# Patient Record
Sex: Male | Born: 1975 | Hispanic: Yes | Marital: Single | Smoking: Former smoker
Health system: Southern US, Community
[De-identification: ages and names within clinical notes are randomized; demographics above are authoritative.]

## PROBLEM LIST (undated history)

## (undated) DIAGNOSIS — E063 Autoimmune thyroiditis: Secondary | ICD-10-CM

---

## 2011-10-16 ENCOUNTER — Other Ambulatory Visit: Payer: Self-pay | Admitting: Family Medicine

## 2011-10-16 DIAGNOSIS — R748 Abnormal levels of other serum enzymes: Secondary | ICD-10-CM

## 2011-10-18 ENCOUNTER — Ambulatory Visit
Admission: RE | Admit: 2011-10-18 | Discharge: 2011-10-18 | Disposition: A | Discharge status: Home / Self Care | Payer: BC Managed Care – PPO | Source: Ambulatory Visit | Attending: Family Medicine | Admitting: Family Medicine

## 2011-10-18 ENCOUNTER — Other Ambulatory Visit: Payer: Self-pay | Admitting: Family Medicine

## 2011-10-18 DIAGNOSIS — R748 Abnormal levels of other serum enzymes: Secondary | ICD-10-CM

## 2011-11-16 ENCOUNTER — Ambulatory Visit (INDEPENDENT_AMBULATORY_CARE_PROVIDER_SITE_OTHER): Payer: BC Managed Care – PPO | Admitting: General Surgery

## 2011-11-16 ENCOUNTER — Encounter (INDEPENDENT_AMBULATORY_CARE_PROVIDER_SITE_OTHER): Payer: Self-pay | Admitting: General Surgery

## 2011-11-16 VITALS — BP 124/76 | HR 72 | Temp 97.8°F | Resp 16 | Ht 68.0 in | Wt 201.6 lb

## 2011-11-16 DIAGNOSIS — K828 Other specified diseases of gallbladder: Secondary | ICD-10-CM

## 2011-11-16 NOTE — Progress Notes (Signed)
Patient ID: Clifford Rosario, male   DOB: 18-Jan-1976, 36 y.o.   MRN: [id number]  Chief Complaint  Patient presents with  . Pre-op Exam    eval GB w/ polyps    HPI Clifford Rosario is a 36 y.o. male.  This patient is referred by Dr. Paulino Rily for evaluation of elevated LFTs and gallbladder polyps. He saw his primary physician for evaluation of some tightness in his right abdomen but this has since resolved. She ordered some LFTs and hepatitis panel as well as a right upper quadrant ultrasound and the ultrasound was concerning for a 7 mm gallbladder polyp as well as some abnormality with liver appeared.  He says that he now has this tightness in his left lower quadrant and denies any food association with his abdominal discomfort. He says that his bowels are normal without any blood in the stools or melena he does have well controlled acid reflux. He denies any fevers or chills or other associated symptoms.  No weight loss. HPI  History reviewed. No pertinent past medical history. GERD History reviewed. No pertinent past surgical history.  History reviewed. No pertinent family history.  Social History History  Substance Use Topics  . Smoking status: Former Smoker    Quit date: 11/16/2011  . Smokeless tobacco: Never Used  . Alcohol Use: No    No Known Allergies  Current Outpatient Prescriptions  Medication Sig Dispense Refill  . omeprazole (PRILOSEC) 10 MG capsule Take 10 mg by mouth daily.        Review of Systems Review of Systems All other review of systems negative or noncontributory except as stated in the HPI  Blood pressure 124/76, pulse 72, temperature 97.8 F (36.6 C), temperature source Temporal, resp. rate 16, height 5\' 8"  (1.727 m), weight 201 lb 9.6 oz (91.445 kg).  Physical Exam Physical Exam Physical Exam  Vitals reviewed. Constitutional: He is oriented to person, place, and time. He appears well-developed and well-nourished. No distress.  HENT:  Head: Normocephalic and  atraumatic.  Mouth/Throat: No oropharyngeal exudate.  Eyes: Conjunctivae and EOM are normal. Pupils are equal, round, and reactive to light. Right eye exhibits no discharge. Left eye exhibits no discharge. No scleral icterus.  Neck: Normal range of motion. No tracheal deviation present.  Cardiovascular: Normal rate, regular rhythm and normal heart sounds.   Pulmonary/Chest: Effort normal and breath sounds normal. No stridor. No respiratory distress. He has no wheezes. He has no rales. He exhibits no tenderness.  Abdominal: Soft. Bowel sounds are normal. He exhibits no distension and no mass. There is no tenderness. There is no rebound and no guarding.  Musculoskeletal: Normal range of motion. He exhibits no edema and no tenderness.  Neurological: He is alert and oriented to person, place, and time.  Skin: Skin is warm and dry. No rash noted. He is not diaphoretic. No erythema. No pallor.  Psychiatric: He has a normal mood and affect. His behavior is normal. Judgment and thought content normal.    Data Reviewed   Assessment    Abnormal liver per ultrasound We will check an MRI of the abdomen as recommended by radiology to  Evaluate his liver abnormality. This may also clarify the issue of his gallbladder polyps. Abnormal LFTs and gallbladder polyps These are most likely nonmobile gallstones but could also present gallbladder polyps as well. From a size criteria, there are not that concerning. I offered him surgery now to include endoscopic cholecystectomy versus six-month interval followup ultrasound to evaluate any growth of  these polyps. He is interested in six-month followup ultrasound since he is currently asymptomatic. We will put him in for repeat ultrasound in 6 months and if he has any interval increase in the size of his polyps or develops any abdominal discomfort which could be related to gallstones, then we will see him back sooner for possible cholecystectomy. I did explain that these  could represent malignancy but generally at this size, they are not concerning.    Plan    MRI of the abdomen and six-month followup ultrasound       Kimbely Whiteaker DAVID 11/16/2011, 9:00 AM

## 2011-11-21 ENCOUNTER — Telehealth (INDEPENDENT_AMBULATORY_CARE_PROVIDER_SITE_OTHER): Payer: Self-pay

## 2011-11-21 ENCOUNTER — Other Ambulatory Visit (INDEPENDENT_AMBULATORY_CARE_PROVIDER_SITE_OTHER): Payer: Self-pay | Admitting: General Surgery

## 2011-11-21 DIAGNOSIS — R160 Hepatomegaly, not elsewhere classified: Secondary | ICD-10-CM

## 2011-11-21 NOTE — Telephone Encounter (Signed)
Patient was LM to call re: scheduling MRI.

## 2011-11-21 NOTE — Telephone Encounter (Signed)
LMOV pt has MRI and [place] scheduled at [facility] ([address]) on 11/28/11 with a 7:45am arrival.  Pt may call for detailed instructions.  NPO after midnight.

## 2011-11-28 ENCOUNTER — Ambulatory Visit
Admission: RE | Admit: 2011-11-28 | Discharge: 2011-11-28 | Disposition: A | Discharge status: Home / Self Care | Payer: BC Managed Care – PPO | Source: Ambulatory Visit | Attending: General Surgery | Admitting: General Surgery

## 2011-11-28 DIAGNOSIS — R160 Hepatomegaly, not elsewhere classified: Secondary | ICD-10-CM

## 2011-11-28 DIAGNOSIS — K828 Other specified diseases of gallbladder: Secondary | ICD-10-CM

## 2011-11-28 MED ORDER — GADOBENATE DIMEGLUMINE 529 MG/ML IV SOLN
18.0000 mL | Freq: Once | INTRAVENOUS | Status: AC | PRN
  Administered 2011-11-28: 18 mL via INTRAVENOUS

## 2011-12-06 ENCOUNTER — Telehealth (INDEPENDENT_AMBULATORY_CARE_PROVIDER_SITE_OTHER): Payer: Self-pay

## 2011-12-06 ENCOUNTER — Other Ambulatory Visit (INDEPENDENT_AMBULATORY_CARE_PROVIDER_SITE_OTHER): Payer: Self-pay

## 2011-12-06 DIAGNOSIS — K824 Cholesterolosis of gallbladder: Secondary | ICD-10-CM

## 2011-12-06 NOTE — Telephone Encounter (Signed)
Left message for patient to call our office re: MRI results- okay, need's f/u US and office visit in 6 months.

## 2011-12-06 NOTE — Telephone Encounter (Signed)
Patient notified of MRI results, 6 month follow up abd U/S and office visit recommended per Dr. Biagio Quint.

## 2012-03-26 ENCOUNTER — Other Ambulatory Visit: Payer: Self-pay | Admitting: Family Medicine

## 2012-03-26 DIAGNOSIS — K824 Cholesterolosis of gallbladder: Secondary | ICD-10-CM

## 2012-03-27 ENCOUNTER — Encounter (INDEPENDENT_AMBULATORY_CARE_PROVIDER_SITE_OTHER): Payer: Self-pay | Admitting: General Surgery

## 2012-04-02 ENCOUNTER — Ambulatory Visit
Admission: RE | Admit: 2012-04-02 | Discharge: 2012-04-02 | Disposition: A | Discharge status: Home / Self Care | Payer: BC Managed Care – PPO | Source: Ambulatory Visit | Attending: Family Medicine | Admitting: Family Medicine

## 2012-04-02 DIAGNOSIS — K824 Cholesterolosis of gallbladder: Secondary | ICD-10-CM

## 2012-05-31 ENCOUNTER — Ambulatory Visit (INDEPENDENT_AMBULATORY_CARE_PROVIDER_SITE_OTHER): Payer: BC Managed Care – PPO | Admitting: General Surgery

## 2012-05-31 VITALS — BP 122/84 | HR 78 | Temp 97.6°F | Resp 16 | Ht 67.0 in | Wt 201.4 lb

## 2012-05-31 DIAGNOSIS — K824 Cholesterolosis of gallbladder: Secondary | ICD-10-CM

## 2012-05-31 NOTE — Progress Notes (Signed)
Subjective:     Patient ID: Clifford Rosario, male   DOB: Apr 02, 1975, 37 y.o.   MRN: [id number]  HPI The patient follows up after six-month followup ultrasound for surveillance of 7 mm gallbladder polyp. He remains asymptomatic and denies any postprandial pain although he does say that he has some occasional tightness in his right upper quadrant but is not associated with food. He had a repeat ultrasound in February which demonstrated 8 mm gallbladder polyp. He did not have repeat LFTs.  Review of Systems     Objective:   Physical Exam No distress and nontoxic-appearing His abdomen is soft and nontender exam and no masses    Assessment:     Gallbladder polyps He is asymptomatic from a gallbladder standpoint. These may be asymptomatic gallstones or gallbladder polyps but they have been fairly stable over the last 6 months. Again discussed with him the possibility of a cholecystectomy versus surveillance. I again discussed the possibility of malignancy with these polyps although from a size standpoint they are still not very concerning for malignancy. He expresses understanding that this could represent a malignancy and at this point, he would like to continue with observation and not to proceed with cholecystectomy. I recommended cholecystectomy due to his age think it is too young to be only about this for so many years especially if this could represent a malignancy.    Plan:     He would like to continue with observation and so I have ordered another ultrasound in 6 months. If at that time it is stable, then he may go 2 annual evaluation. If he decides that he would like to proceed with cholecystectomy we'll be happy to him back sooner

## 2012-10-24 ENCOUNTER — Telehealth (INDEPENDENT_AMBULATORY_CARE_PROVIDER_SITE_OTHER): Payer: Self-pay | Admitting: General Surgery

## 2012-10-24 NOTE — Telephone Encounter (Signed)
LMOM letting pt know that he is scheduled for an US abdomen complete at [facility] located at [address] on 10/29/12 at 7:45am. Also explained that he will need to be NPO after midnight for this exam.

## 2012-10-29 ENCOUNTER — Ambulatory Visit
Admission: RE | Admit: 2012-10-29 | Discharge: 2012-10-29 | Disposition: A | Discharge status: Home / Self Care | Payer: BC Managed Care – PPO | Source: Ambulatory Visit | Attending: General Surgery | Admitting: General Surgery

## 2012-10-29 DIAGNOSIS — K824 Cholesterolosis of gallbladder: Secondary | ICD-10-CM

## 2012-11-27 ENCOUNTER — Encounter (INDEPENDENT_AMBULATORY_CARE_PROVIDER_SITE_OTHER): Payer: Self-pay

## 2012-11-27 ENCOUNTER — Encounter (INDEPENDENT_AMBULATORY_CARE_PROVIDER_SITE_OTHER): Payer: Self-pay | Admitting: General Surgery

## 2012-11-27 ENCOUNTER — Ambulatory Visit (INDEPENDENT_AMBULATORY_CARE_PROVIDER_SITE_OTHER): Payer: BC Managed Care – PPO | Admitting: General Surgery

## 2012-11-27 VITALS — BP 120/80 | HR 100 | Temp 98.6°F | Resp 15 | Ht 68.0 in | Wt 202.4 lb

## 2012-11-27 DIAGNOSIS — K824 Cholesterolosis of gallbladder: Secondary | ICD-10-CM

## 2012-11-27 NOTE — Progress Notes (Signed)
Subjective:     Patient ID: Clifford Rosario, male   DOB: [birth year], 37 y.o.   MRN: [id number]  HPI This patient follows up status post 6 month followup ultrasound of his gallbladder to evaluateStability of gallbladder polyps. He has 7 mm gallbladder polyp which on recent ultrasound demonstrates stability of compared to his previous ultrasounds. When asked about symptoms he says that his only symptom is a cramp in the right upper quadrant after eating large meals and then lying flat.  He says that this is immediately Improved when standing. Otherwise he denies any abdominal pain or symptoms that he attributes to his gallbladder.  Review of Systems     Objective:   Physical Exam Abdomen is soft and nontender and nondistended with no masses    Assessment:     Gallbladder polyps-stable I again discussed with him the options for cholecystectomy 2 completely rule out malignancy versus continued observation. He seems to be fairly asymptomatic although he does have symptoms which could be attributed to his gallbladder. His ultrasound demonstrates the ability of the gallbladder polyps at 7 mm. This Would suggest a benign nature since they have not been growing over the last year. He is still not interested in cholecystectomy. I did explain that I cannot completely rule out malignancy without removing the gallbladder and he is considering cholecystectomy but now he would like to wait until next year when he has improved benefits. If he decides to continue with surveillance, I would not recommend every 6 month ultrasound but he could probably do annual surveillance I think that this would be satisfactory. However, if he is concerned over this, then I would just recommend cholecystectomy    Plan:     Surveillance ultrasound of the gallbladder in one year or cholecystectomy sooner if he desires

## 2015-07-23 ENCOUNTER — Other Ambulatory Visit: Payer: Self-pay | Admitting: Family Medicine

## 2015-07-23 DIAGNOSIS — N200 Calculus of kidney: Secondary | ICD-10-CM

## 2015-07-23 DIAGNOSIS — K7581 Nonalcoholic steatohepatitis (NASH): Secondary | ICD-10-CM

## 2015-07-23 DIAGNOSIS — K824 Cholesterolosis of gallbladder: Secondary | ICD-10-CM

## 2015-08-03 ENCOUNTER — Inpatient Hospital Stay: Admission: RE | Admit: 2015-08-03 | Payer: Self-pay | Source: Ambulatory Visit

## 2015-09-16 ENCOUNTER — Ambulatory Visit
Admission: RE | Admit: 2015-09-16 | Discharge: 2015-09-16 | Disposition: A | Discharge status: Home / Self Care | Payer: BLUE CROSS/BLUE SHIELD | Source: Ambulatory Visit | Attending: Family Medicine | Admitting: Family Medicine

## 2015-09-16 DIAGNOSIS — K824 Cholesterolosis of gallbladder: Secondary | ICD-10-CM

## 2015-09-16 DIAGNOSIS — N200 Calculus of kidney: Secondary | ICD-10-CM

## 2015-09-16 DIAGNOSIS — K7581 Nonalcoholic steatohepatitis (NASH): Secondary | ICD-10-CM

## 2017-01-23 ENCOUNTER — Other Ambulatory Visit: Payer: Self-pay | Admitting: Family Medicine

## 2017-01-23 ENCOUNTER — Ambulatory Visit
Admission: RE | Admit: 2017-01-23 | Discharge: 2017-01-23 | Disposition: A | Discharge status: Home / Self Care | Payer: BLUE CROSS/BLUE SHIELD | Source: Ambulatory Visit | Attending: Family Medicine | Admitting: Family Medicine

## 2017-01-23 DIAGNOSIS — E8881 Metabolic syndrome: Secondary | ICD-10-CM

## 2020-01-14 DIAGNOSIS — E8881 Metabolic syndrome: Secondary | ICD-10-CM | POA: Diagnosis not present

## 2020-01-14 DIAGNOSIS — Z79899 Other long term (current) drug therapy: Secondary | ICD-10-CM | POA: Diagnosis not present

## 2020-01-14 DIAGNOSIS — E782 Mixed hyperlipidemia: Secondary | ICD-10-CM | POA: Diagnosis not present

## 2020-01-14 DIAGNOSIS — R7303 Prediabetes: Secondary | ICD-10-CM | POA: Diagnosis not present

## 2020-01-14 DIAGNOSIS — I10 Essential (primary) hypertension: Secondary | ICD-10-CM | POA: Diagnosis not present

## 2020-01-14 DIAGNOSIS — Z0000 Encounter for general adult medical examination without abnormal findings: Secondary | ICD-10-CM | POA: Diagnosis not present

## 2020-03-29 DIAGNOSIS — E049 Nontoxic goiter, unspecified: Secondary | ICD-10-CM | POA: Diagnosis not present

## 2020-03-29 DIAGNOSIS — G479 Sleep disorder, unspecified: Secondary | ICD-10-CM | POA: Diagnosis not present

## 2020-03-29 DIAGNOSIS — E8881 Metabolic syndrome: Secondary | ICD-10-CM | POA: Diagnosis not present

## 2020-04-04 ENCOUNTER — Other Ambulatory Visit: Payer: Self-pay

## 2020-04-04 ENCOUNTER — Encounter (HOSPITAL_COMMUNITY): Payer: Self-pay | Admitting: Emergency Medicine

## 2020-04-04 ENCOUNTER — Emergency Department (HOSPITAL_COMMUNITY)
Admission: EM | Admit: 2020-04-04 | Discharge: 2020-04-04 | Disposition: A | Discharge status: Home / Self Care | Payer: 59 | Attending: Emergency Medicine | Admitting: Emergency Medicine

## 2020-04-04 ENCOUNTER — Emergency Department (HOSPITAL_COMMUNITY): Payer: 59

## 2020-04-04 DIAGNOSIS — J341 Cyst and mucocele of nose and nasal sinus: Secondary | ICD-10-CM | POA: Diagnosis not present

## 2020-04-04 DIAGNOSIS — Z79899 Other long term (current) drug therapy: Secondary | ICD-10-CM | POA: Diagnosis not present

## 2020-04-04 DIAGNOSIS — Z8616 Personal history of COVID-19: Secondary | ICD-10-CM | POA: Insufficient documentation

## 2020-04-04 DIAGNOSIS — J392 Other diseases of pharynx: Secondary | ICD-10-CM | POA: Diagnosis not present

## 2020-04-04 DIAGNOSIS — E069 Thyroiditis, unspecified: Secondary | ICD-10-CM | POA: Insufficient documentation

## 2020-04-04 DIAGNOSIS — Z87891 Personal history of nicotine dependence: Secondary | ICD-10-CM | POA: Diagnosis not present

## 2020-04-04 DIAGNOSIS — R1310 Dysphagia, unspecified: Secondary | ICD-10-CM | POA: Diagnosis not present

## 2020-04-04 DIAGNOSIS — R000 Tachycardia, unspecified: Secondary | ICD-10-CM | POA: Diagnosis not present

## 2020-04-04 DIAGNOSIS — M542 Cervicalgia: Secondary | ICD-10-CM | POA: Diagnosis present

## 2020-04-04 DIAGNOSIS — E063 Autoimmune thyroiditis: Secondary | ICD-10-CM | POA: Diagnosis not present

## 2020-04-04 HISTORY — DX: Autoimmune thyroiditis: E06.3

## 2020-04-04 LAB — COMPREHENSIVE METABOLIC PANEL
ALT: 69 U/L — ABNORMAL HIGH (ref 0–44)
AST: 54 U/L — ABNORMAL HIGH (ref 15–41)
Albumin: 3.2 g/dL — ABNORMAL LOW (ref 3.5–5.0)
Alkaline Phosphatase: 99 U/L (ref 38–126)
Anion gap: 10 (ref 5–15)
BUN: 17 mg/dL (ref 6–20)
CO2: 25 mmol/L (ref 22–32)
Calcium: 9.8 mg/dL (ref 8.9–10.3)
Chloride: 101 mmol/L (ref 98–111)
Creatinine, Ser: 0.92 mg/dL (ref 0.61–1.24)
GFR, Estimated: >60 mL/min (ref >60)
Glucose, Bld: 116 mg/dL — ABNORMAL HIGH (ref 70–99)
Potassium: 4.3 mmol/L (ref 3.5–5.1)
Sodium: 136 mmol/L (ref 135–145)
Total Bilirubin: 0.6 mg/dL (ref 0.3–1.2)
Total Protein: 9 g/dL — ABNORMAL HIGH (ref 6.5–8.1)

## 2020-04-04 LAB — CBC WITH DIFFERENTIAL/PLATELET
Abs Immature Granulocytes: 0.07 10*3/uL (ref 0.00–0.07)
Basophils Absolute: 0 10*3/uL (ref 0.0–0.1)
Basophils Relative: 1 %
Eosinophils Absolute: 0 10*3/uL (ref 0.0–0.5)
Eosinophils Relative: 0 %
HCT: 37.2 % — ABNORMAL LOW (ref 39.0–52.0)
Hemoglobin: 11.6 g/dL — ABNORMAL LOW (ref 13.0–17.0)
Immature Granulocytes: 1 %
Lymphocytes Relative: 16 %
Lymphs Abs: 1.3 10*3/uL (ref 0.7–4.0)
MCH: 26.9 pg (ref 26.0–34.0)
MCHC: 31.2 g/dL (ref 30.0–36.0)
MCV: 86.3 fL (ref 80.0–100.0)
Monocytes Absolute: 0.9 10*3/uL (ref 0.1–1.0)
Monocytes Relative: 11 %
Neutro Abs: 5.7 10*3/uL (ref 1.7–7.7)
Neutrophils Relative %: 71 %
Platelets: 636 10*3/uL — ABNORMAL HIGH (ref 150–400)
RBC: 4.31 MIL/uL (ref 4.22–5.81)
RDW: 11.7 % (ref 11.5–15.5)
WBC: 8 10*3/uL (ref 4.0–10.5)
nRBC: 0 % (ref 0.0–0.2)

## 2020-04-04 LAB — TSH: TSH: <0.01 u[IU]/mL — ABNORMAL LOW (ref 0.350–4.500)

## 2020-04-04 MED ORDER — ALUM & MAG HYDROXIDE-SIMETH 400-400-40 MG/5ML PO SUSP: 5.0000 mL | Freq: Four times a day (QID) | ORAL | 0 refills | Status: DC | PRN

## 2020-04-04 MED ORDER — ALUM & MAG HYDROXIDE-SIMETH 200-200-20 MG/5ML PO SUSP
30.0000 mL | Freq: Once | ORAL | Status: AC
  Administered 2020-04-04: 30 mL via ORAL
  Filled 2020-04-04: qty 30

## 2020-04-04 MED ORDER — IOHEXOL 300 MG/ML  SOLN
75.0000 mL | Freq: Once | INTRAMUSCULAR | Status: AC | PRN
  Administered 2020-04-04: 75 mL via INTRAVENOUS

## 2020-04-04 MED ORDER — PREDNISONE 10 MG PO TABS: 40.0000 mg | ORAL_TABLET | Freq: Every day | ORAL | 0 refills | Status: AC

## 2020-04-04 MED ORDER — DEXAMETHASONE SODIUM PHOSPHATE 10 MG/ML IJ SOLN
10.0000 mg | Freq: Once | INTRAMUSCULAR | Status: AC
  Administered 2020-04-04: 10 mg via INTRAVENOUS
  Filled 2020-04-04: qty 1

## 2020-04-04 MED ORDER — SODIUM CHLORIDE 0.9 % IV BOLUS
1000.0000 mL | Freq: Once | INTRAVENOUS | Status: AC
  Administered 2020-04-04: 1000 mL via INTRAVENOUS

## 2020-04-04 MED ORDER — LIDOCAINE VISCOUS HCL 2 % MT SOLN
15.0000 mL | Freq: Once | OROMUCOSAL | Status: AC
  Administered 2020-04-04: 15 mL via ORAL
  Filled 2020-04-04: qty 15

## 2020-04-04 NOTE — ED Triage Notes (Signed)
Pt COVID + 1/16.  Reports difficulty swallowing that started while having COVID.  Loss 20 lbs.  Diagnosed with autoimmune thyroiditis.  For the last 2 weeks he has been unable to sleep.  Taking melatonin.

## 2020-04-04 NOTE — ED Provider Notes (Signed)
[facility] EMERGENCY DEPARTMENT Provider Note   CSN: [id number] Arrival date & time: 04/04/20  1538     History Chief Complaint  Patient presents with  . Dysphagia  . Insomnia    Clifford Rosario is a 45 y.o. male presenting for evaluation of neck pain and swelling, difficulty sleeping, palpitations.  Patient states he became ill with COVID mid January.  Since then, he has been having symptoms of throat pain and swelling, difficulty sleeping, palpitations.  He has had a 20 pound weight loss.  He states his difficulty sleeping is not due to pain, testing of the fall asleep and stay asleep.  He reports mild throat discomfort, difficulty swallowing and that he feels he has some things on his throat/stuck in his throat.  He has seen his primary care doctor, found to have newly low TSH at 0.2.  Started on propranolol.  His PCP diagnosed him with thyroiditis, recommends follow-up with endocrinology, however there is a wait to be seen.  Patient reports familial history of thyroiditis.  Denies history of heartburn or reflux, but states he has been given medicine for this but does not take it.  He takes no other medications daily.  His PCP has tried hydroxyzine for sleep and he has tried melatonin without improvement.  He denies current fevers, chills, chest pain, shortness of breath, nausea, vomiting, abdominal pain, urinary symptoms, abnormal bowel movements.  HPI     Past Medical History:  Diagnosis Date  . Autoimmune thyroiditis     There are no problems to display for this patient.   History reviewed. No pertinent surgical history.     No family history on file.  Social History   Tobacco Use  . Smoking status: Former Smoker    Quit date: 11/16/2011    Years since quitting: 8.3  . Smokeless tobacco: Never Used  Substance Use Topics  . Alcohol use: No  . Drug use: No    Home Medications Prior to Admission medications   Medication Sig Start Date End Date  Taking? Authorizing Provider  alum & mag hydroxide-simeth (MAALOX ADVANCED MAX ST) 400-400-40 MG/5ML suspension Take 5 mLs by mouth every 6 (six) hours as needed for indigestion. 04/04/20  Yes Aubery Douthat, PA-C  predniSONE (DELTASONE) 10 MG tablet Take 4 tablets (40 mg total) by mouth daily for 5 days. 04/05/20 04/10/20 Yes Blondell Laperle, PA-C  omeprazole (PRILOSEC) 10 MG capsule Take 10 mg by mouth daily.    [provider]    Allergies    Patient has no known allergies.  Review of Systems   Review of Systems  Constitutional: Positive for unexpected weight change.  HENT: Positive for sore throat and trouble swallowing.   Cardiovascular: Positive for palpitations.  Psychiatric/Behavioral: Positive for sleep disturbance.  All other systems reviewed and are negative.   Physical Exam Updated Vital Signs BP 120/77   Pulse (!) 110   Temp 98.3 F (36.8 C) (Oral)   Resp 16   SpO2 95%   Physical Exam Vitals and nursing note reviewed.  Constitutional:      General: He is not in acute distress.    Appearance: He is well-developed and well-nourished.     Comments: Appears nontoxic  HENT:     Head: Normocephalic and atraumatic.     Comments: OP clear. handling secretions easily.  Eyes:     Extraocular Movements: EOM normal.     Conjunctiva/sclera: Conjunctivae normal.     Pupils: Pupils are equal, round,  and reactive to light.  Neck:     Thyroid: Thyroid mass, thyromegaly and thyroid tenderness present.     Comments: Mild thyromegaly with tenderness. Cardiovascular:     Rate and Rhythm: Regular rhythm. Tachycardia present.     Pulses: Normal pulses and intact distal pulses.     Comments: Mildly tachycardic Pulmonary:     Effort: Pulmonary effort is normal. No respiratory distress.     Breath sounds: Normal breath sounds. No wheezing.  Abdominal:     General: There is no distension.     Palpations: Abdomen is soft. There is no mass.     Tenderness: There is no  abdominal tenderness. There is no guarding or rebound.     Comments: No ttp  Musculoskeletal:        General: Normal range of motion.     Cervical back: Normal range of motion and neck supple.  Skin:    General: Skin is warm and dry.     Capillary Refill: Capillary refill takes less than 2 seconds.  Neurological:     Mental Status: He is alert and oriented to person, place, and time.  Psychiatric:        Mood and Affect: Mood and affect normal.     ED Results / Procedures / Treatments   Labs (all labs ordered are listed, but only abnormal results are displayed) Labs Reviewed  CBC WITH DIFFERENTIAL/PLATELET - Abnormal; Notable for the following components:      Result Value   Hemoglobin 11.6 (*)    HCT 37.2 (*)    Platelets 636 (*)    All other components within normal limits  COMPREHENSIVE METABOLIC PANEL - Abnormal; Notable for the following components:   Glucose, Bld 116 (*)    Total Protein 9.0 (*)    Albumin 3.2 (*)    AST 54 (*)    ALT 69 (*)    All other components within normal limits  TSH - Abnormal; Notable for the following components:   TSH <0.010 (*)    All other components within normal limits  T4  T3    EKG EKG Interpretation  Date/Time:  Sunday April 04 2020 17:05:14 EST Ventricular Rate:  100 PR Interval:    QRS Duration: 86 QT Interval:  312 QTC Calculation: 403 R Axis:   23 Text Interpretation: Sinus tachycardia Confirmed by Virgina NorfolkAdam, [place] [phone]) on 04/04/2020 5:20:05 PM   Radiology CT Soft Tissue Neck W Contrast  Result Date: 04/04/2020 CLINICAL DATA:  Neck mass, initial workup. Additional history provided: Patient COVID positive 116, reports difficulty swallowing which began while having COVID, loss of 20 pounds, diagnosed with autoimmune thyroiditis, unable to sleep for the past 2 weeks. EXAM: CT NECK WITH CONTRAST TECHNIQUE: Multidetector CT imaging of the neck was performed using the standard protocol following the bolus administration  of intravenous contrast. CONTRAST:  75mL OMNIPAQUE IOHEXOL 300 MG/ML  SOLN COMPARISON:  Radiographs of the cervical spine 01/23/2017. FINDINGS: Pharynx and larynx: Streak and beam hardening artifact arising from dental restoration partially obscures the oral cavity and pharynx. No appreciable swelling or discrete mass within the oral cavity, pharynx or larynx. Postinflammatory calcifications within the right palatine tonsil. There is mild retropharyngeal edema measuring up to 4 mm in thickness, extending from the C2 level caudally to the C4-C5 level. Salivary glands: No inflammation, mass, or stone. Thyroid: 5 mm right thyroid lobe nodule, not meeting consensus criteria for ultrasound follow-up. The thyroid gland is heterogeneous in appearance with mild-to-moderate surrounding  edema. Lymph nodes: No pathologically enlarged cervical chain lymph nodes. Vascular: The major vascular structures of the neck are patent. Limited intracranial: No acute intracranial abnormality identified. Visualized orbits: No mass or acute finding. Mastoids and visualized paranasal sinuses: Mild ethmoid sinus mucosal thickening. Mild partial opacification of the left ethmoid air cells. Small bilateral maxillary sinus mucous retention cysts. No significant mastoid effusion Skeleton: Cervical spondylosis. No acute bony abnormality or aggressive osseous lesion. Upper chest: No consolidation within the imaged lung apices. IMPRESSION: Heterogeneous appearance of the thyroid gland with mild-to-moderate surrounding edema. Findings are compatible with the provided history of thyroiditis. Mild retropharyngeal edema measuring up to 4 mm in thickness and spanning the C2 through C4-C5 levels. This edema is likely inflammatory/infectious in etiology, possibly secondary to the above described thyroiditis. Mild ethmoid sinusitis. Small bilateral maxillary sinus mucous retention cysts. Electronically Signed   By: Jackey Loge DO   On: 04/04/2020 18:52     Procedures Procedures   Medications Ordered in ED Medications  alum & mag hydroxide-simeth (MAALOX/MYLANTA) 200-200-20 MG/5ML suspension 30 mL (30 mLs Oral Given 04/04/20 1723)    And  lidocaine (XYLOCAINE) 2 % viscous mouth solution 15 mL (15 mLs Oral Given 04/04/20 1723)  dexamethasone (DECADRON) injection 10 mg (10 mg Intravenous Given 04/04/20 1728)  iohexol (OMNIPAQUE) 300 MG/ML solution 75 mL (75 mLs Intravenous Contrast Given 04/04/20 1815)  sodium chloride 0.9 % bolus 1,000 mL (1,000 mLs Intravenous New Bag/Given 04/04/20 1955)    ED Course  I have reviewed the triage vital signs and the nursing notes.  Pertinent labs & imaging results that were available during my care of the patient were reviewed by me and considered in my medical decision making (see chart for details).    MDM Rules/Calculators/A&P                          Patient presented for evaluation of difficulty swallowing, difficulty sleeping, palpitations.  On exam, patient is mildly tachycardic.  He does have some swelling and tenderness of the neck.  Per labs drawn by PCP, patient with newly low TSH level, most consistent with post viral thyroiditis.  As patient has mass and difficulty swallowing however, will obtain labs and CT neck for further evaluation.  Will treat with steroids and GI cocktail.  Labs consistent with low TSH/thyroiditis.  Minimally elevated LFTs, likely in the setting of recent viral illness.  Without abdominal pain, nausea, vomiting, I do not believe patient needs acute abdominal imaging.  While patient is mildly tachycardic with a low TSH, he is not confused or febrile, doubt thyroid storm.  CT neck shows mild to moderate surrounding edema of the thyroid gland with mild retropharyngeal edema measuring up to 4 mm in thickness spanning from C2-C5.  As patient has edema, will consult with ENT.  Discussed with Dr. Jenne Pane of ENT, who states that if patient does not have an impending airway, there is  no acute ENT needs at this time.  Does not need follow-up with ENT.    On reassessment, patient reports improvement of symptoms with GI cocktail and prednisone.  He is tolerating p.o. without difficulty.  Airway remains intact after almost 5 hours in the ER.  As such, I do not believe he needs emergent hospitalization, however he will need close follow-up.  PCP is working on referral to endocrinology.  Will discharge with prednisone, and have patient continue taking propranolol for rapid heart rate and palpitations.  Discussed  likely self-limiting course. Discussed with attending, Dr. Stevie Kern agrees to plan.  At this time, patient appears safe discharge.  Return precautions given.  Patient states he understands and agrees to plan.  Final Clinical Impression(s) / ED Diagnoses Final diagnoses:  Thyroiditis    Rx / DC Orders ED Discharge Orders         Ordered    predniSONE (DELTASONE) 10 MG tablet  Daily        04/04/20 2016    alum & mag hydroxide-simeth (MAALOX ADVANCED MAX ST) 400-400-40 MG/5ML suspension  Every 6 hours PRN        04/04/20 2016           Alveria Apley, PA-C 04/04/20 2021    Milagros Loll, MD 04/05/20 1242

## 2020-04-04 NOTE — Discharge Instructions (Addendum)
Continue taking the medication prescribed to you by your primary care doctor to help with your heart rate. Start the prednisone tomorrow to help with inflammation. Continue taking Tylenol as needed for feelings of fever or pain. Use Maalox liquid as needed for throat discomfort. Follow-up with your primary care doctor within the next week for recheck of your symptoms. Follow-up with endocrinologist for further management of your thyroiditis. Return to the emergency room immediately if you develop difficulty breathing, inability to swallow your own spit, persistent fevers, confusion, or any new, worsening, or concerning symptoms.

## 2020-04-05 LAB — T4: T4, Total: 15.5 ug/dL — ABNORMAL HIGH (ref 4.5–12.0)

## 2020-04-05 LAB — T3: T3, Total: 183 ng/dL — ABNORMAL HIGH (ref 71–180)

## 2020-04-06 ENCOUNTER — Other Ambulatory Visit: Payer: Self-pay

## 2020-04-07 ENCOUNTER — Encounter: Payer: Self-pay | Admitting: Internal Medicine

## 2020-04-07 ENCOUNTER — Ambulatory Visit: Payer: 59 | Admitting: Internal Medicine

## 2020-04-07 VITALS — BP 120/84 | HR 89 | Ht 69.0 in | Wt 184.2 lb

## 2020-04-07 DIAGNOSIS — E061 Subacute thyroiditis: Secondary | ICD-10-CM | POA: Diagnosis not present

## 2020-04-07 MED ORDER — ATENOLOL 25 MG PO TABS: 25.0000 mg | ORAL_TABLET | Freq: Every day | ORAL | 3 refills | Status: DC

## 2020-04-07 NOTE — Progress Notes (Signed)
Name: Clifford Rosario  MRN/ DOB: [id number], 02-16-1976    Age/ Sex: 45 y.o., male    PCP: Mila Palmer, MD   Reason for Endocrinology Evaluation: Hyperthyroidism     Date of Initial Endocrinology Evaluation: 04/07/2020     HPI: Mr. Clifford Rosario is a 45 y.o. male with a past medical history of hyperthyroidism, psoriasis, fatty liver, pre-diabetes and GERD.  The patient presented for initial endocrinology clinic visit on 04/07/2020 for consultative assistance with his Hyperthyroidism   He was diagnosed with hyperthyroidism in 03/2020 with a suppressed TSH  Of <0.01 uIU/mL and elevated total T4 at 15.5 ug/dL and elevated T3 at 878 ng/dL.    Had a COVID infection in 02/2020. After infection noted swelling of the thyroid with difficulty swelling as well as pain  , he presented to ED on 2/13  He was treated with steroids which helped.   He is currently on steroids and has 3 more days of it.    Has lost 25 lbs since 02/2020 Denies palpitations  Denies diarrhea  Denies tremors  Has insomnia   Sister  ( thyromegaly )And brother with thyroid disease ( hyperthyroidism)    HISTORY:  Past Medical History:  Past Medical History:  Diagnosis Date  . Autoimmune thyroiditis     Past Surgical History: No past surgical history on file.   Social History:  reports that he quit smoking about 8 years ago. He has never used smokeless tobacco. He reports that he does not drink alcohol and does not use drugs.  Family History: family history is not on file.   HOME MEDICATIONS: Allergies as of 04/07/2020   No Known Allergies     Medication List       Accurate as of April 07, 2020  3:26 PM. If you have any questions, ask your nurse or doctor.        STOP taking these medications   alum & mag hydroxide-simeth 400-400-40 MG/5ML suspension Commonly known as: Maalox Advanced Max St Stopped by: Scarlette Shorts, MD   propranolol 10 MG tablet Commonly known as: INDERAL Stopped by:  Scarlette Shorts, MD     TAKE these medications   amLODipine 5 MG tablet Commonly known as: NORVASC Take 2 tablets by mouth daily.   atenolol 25 MG tablet Commonly known as: TENORMIN Take 1 tablet (25 mg total) by mouth daily. Started by: Scarlette Shorts, MD   fenofibrate 145 MG tablet Commonly known as: TRICOR TAKE 1 TABLET BY MOUTH EVERYDAY AT BEDTIME   hydrOXYzine 10 MG tablet Commonly known as: ATARAX/VISTARIL Take 10 mg by mouth at bedtime as needed.   omeprazole 10 MG capsule Commonly known as: PRILOSEC Take 10 mg by mouth daily.   predniSONE 10 MG tablet Commonly known as: DELTASONE Take 4 tablets (40 mg total) by mouth daily for 5 days.         REVIEW OF SYSTEMS: A comprehensive ROS was conducted with the patient and is negative except as per HPI    OBJECTIVE:  VS: BP 120/84   Pulse 89   Ht 5\' 9"  (1.753 m)   Wt 184 lb 4 oz (83.6 kg)   SpO2 98%   BMI 27.21 kg/m    Wt Readings from Last 3 Encounters:  04/07/20 184 lb 4 oz (83.6 kg)  11/27/12 202 lb 6.4 oz (91.8 kg)  05/31/12 201 lb 6.4 oz (91.4 kg)     EXAM: General: Pt appears well and is in  NAD  Eyes: External eye exam normal without stare, lid lag or exophthalmos.  EOM intact.   Neck: General: Supple without adenopathy. Thyroid: Thyroid size normal.  No goiter or nodules appreciated. No thyroid bruit.  Lungs: Clear with good BS bilat with no rales, rhonchi, or wheezes  Heart: Auscultation: RRR.  Abdomen: Normoactive bowel sounds, soft, nontender, without masses or organomegaly palpable  Extremities:  BL LE: No pretibial edema normal ROM and strength.  Skin: Hair: Texture and amount normal with gender appropriate distribution Skin Inspection: No rashes, acanthosis nigricans/skin tags. No lipohypertrophy Skin Palpation: Skin temperature, texture, and thickness normal to palpation  Neuro: Cranial nerves: II - XII grossly intact  Motor: Normal strength throughout DTRs: 2+ and  symmetric in UE without delay in relaxation phase  Mental Status: Judgment, insight: Intact Orientation: Oriented to time, place, and person Mood and affect: No depression, anxiety, or agitation     DATA REVIEWED:   Results for DEMETRICK, EICHENBERGER (MRN [id number]) as of 04/07/2020 08:38  Ref. Range 04/04/2020 16:57 04/04/2020 16:58  Sodium Latest Ref Range: 135 - 145 mmol/L 136   Potassium Latest Ref Range: 3.5 - 5.1 mmol/L 4.3   Chloride Latest Ref Range: 98 - 111 mmol/L 101   CO2 Latest Ref Range: 22 - 32 mmol/L 25   Glucose Latest Ref Range: 70 - 99 mg/dL 163 (H)   BUN Latest Ref Range: 6 - 20 mg/dL 17   Creatinine Latest Ref Range: 0.61 - 1.24 mg/dL 8.46   Calcium Latest Ref Range: 8.9 - 10.3 mg/dL 9.8   Anion gap Latest Ref Range: 5 - 15  10   Alkaline Phosphatase Latest Ref Range: 38 - 126 U/L 99   Albumin Latest Ref Range: 3.5 - 5.0 g/dL 3.2 (L)   AST Latest Ref Range: 15 - 41 U/L 54 (H)   ALT Latest Ref Range: 0 - 44 U/L 69 (H)   Total Protein Latest Ref Range: 6.5 - 8.1 g/dL 9.0 (H)   Total Bilirubin Latest Ref Range: 0.3 - 1.2 mg/dL 0.6   GFR, Estimated Latest Ref Range: >60 mL/min >60   WBC Latest Ref Range: 4.0 - 10.5 K/uL 8.0   RBC Latest Ref Range: 4.22 - 5.81 MIL/uL 4.31   Hemoglobin Latest Ref Range: 13.0 - 17.0 g/dL 65.9 (L)   HCT Latest Ref Range: 39.0 - 52.0 % 37.2 (L)   MCV Latest Ref Range: 80.0 - 100.0 fL 86.3   MCH Latest Ref Range: 26.0 - 34.0 pg 26.9   MCHC Latest Ref Range: 30.0 - 36.0 g/dL 93.5   RDW Latest Ref Range: 11.5 - 15.5 % 11.7   Platelets Latest Ref Range: 150 - 400 K/uL 636 (H)   nRBC Latest Ref Range: 0.0 - 0.2 % 0.0   Neutrophils Latest Units: % 71   Lymphocytes Latest Units: % 16   Monocytes Relative Latest Units: % 11   Eosinophil Latest Units: % 0   Basophil Latest Units: % 1   Immature Granulocytes Latest Units: % 1   NEUT# Latest Ref Range: 1.7 - 7.7 K/uL 5.7   Lymphocyte # Latest Ref Range: 0.7 - 4.0 K/uL 1.3   Monocyte # Latest Ref  Range: 0.1 - 1.0 K/uL 0.9   Eosinophils Absolute Latest Ref Range: 0.0 - 0.5 K/uL 0.0   Basophils Absolute Latest Ref Range: 0.0 - 0.1 K/uL 0.0   Abs Immature Granulocytes Latest Ref Range: 0.00 - 0.07 K/uL 0.07   TSH Latest Ref Range: 0.350 -  4.500 uIU/mL  <0.010 (L)  Triiodothyronine (T3) Latest Ref Range: 71 - 180 ng/dL 654 (H)   Thyroxine (T4) Latest Ref Range: 4.5 - 12.0 ug/dL 65.0 (H)     ASSESSMENT/PLAN/RECOMMENDATIONS:   1. Subacute Thyroiditis:   - Pt has clinically improved since being on glucocorticoids  - No local neck symptoms, apparently have resolved since being on the prednisone - We discussed the natural history of subacute thyroiditis, this is most likely caused by viral infection.  - Given strong FH of thyroid disease, I am going to check TRAb levels as well  - He has propanolol to take TID  But has been taking it as needed with a heart rate in the 70's. Pt advised resting heart rate is 60-100 and I prefer for it to be in the 70's. I am going to switch Propranolol to Atenolol as below  - He was advised to use NSAID's as needed once he is done with prednisone .      Medications : Stop Propranolol  Start Atenolol 25 mg daily      Labs in 4 weeks  F/U in 8 weeks  Signed electronically by: Lyndle Herrlich, MD  [facility]  [facility] [address] [place], [place] [address] Phone: [phone] FAX: [phone]   CC: Mila Palmer, MD [address] [place] [address] Phone: [phone] Fax: [phone]   Return to Endocrinology clinic as below: Future Appointments  Date Time Provider Department Center  05/05/2020  8:15 AM [facility] None  06/02/2020  8:30 AM Shamleffer, Konrad Dolores, MD [facility] None

## 2020-04-07 NOTE — Patient Instructions (Signed)
-   Stop Propranolol  - Start Atenolol 25 mg once daily  - Once you are done with the prednisone, you may use Aleven 200 mg twice a day as needed. Let me know if this doesn't help

## 2020-04-10 LAB — TRAB (TSH RECEPTOR BINDING ANTIBODY): TRAB: <1 IU/L (ref <=2.00)

## 2020-04-13 ENCOUNTER — Telehealth: Payer: Self-pay | Admitting: Internal Medicine

## 2020-04-13 ENCOUNTER — Telehealth: Payer: Self-pay | Admitting: Family Medicine

## 2020-04-13 MED ORDER — PREDNISONE 10 MG PO TABS: 10.0000 mg | ORAL_TABLET | ORAL | 0 refills | Status: DC

## 2020-04-13 NOTE — Telephone Encounter (Signed)
Spoken to patient and notified Dr Shamleffer's comments. Verbalized understanding.   

## 2020-04-13 NOTE — Telephone Encounter (Signed)
Message left for patient to return my call.  

## 2020-04-13 NOTE — Telephone Encounter (Signed)
Addressed in another encounter

## 2020-04-13 NOTE — Telephone Encounter (Signed)
Patient is calling about her husband's meds and would like a call back for assistance. Sorry no further info was given

## 2020-04-13 NOTE — Telephone Encounter (Signed)
Patient called to let Dr Lonzo Cloud know that he is experiencing another episode of an inflamed thyroid.  Symptoms are :  Tachycardia  (heart rate 110 BPM), swollen neck, trouble swallowing, and not feeling well.  Patient states that he was advised to call so that steroids could be sent in for him.  Pharmacy is [facility] [address] in [employer] back number for patient is [phone]

## 2020-04-23 ENCOUNTER — Telehealth: Payer: Self-pay | Admitting: Internal Medicine

## 2020-04-23 NOTE — Telephone Encounter (Signed)
I have called patient and he stated that today will be his last dosage of 0.5 tablet. He starting feel sick again. He stated that he had a fever last night at 102 and resting heart rate of 130. He stated that his throat is swelling again. Please advise, he wanted to know what should he do next.

## 2020-04-23 NOTE — Telephone Encounter (Signed)
Unfortunately its too late in the day on a Friday and I won't be able to see him, and I suggest he goes to urgent care. He has already had plenty of steroid and would need an exam before a refill.

## 2020-04-23 NOTE — Telephone Encounter (Signed)
Spoken to patient and notified Dr Shamleffer's comments. Verbalized understanding.   

## 2020-04-23 NOTE — Telephone Encounter (Signed)
Pt has a question regarding his prednisone. He states he isn't feeling well and wants someone to give him a call at [phone].

## 2020-05-05 ENCOUNTER — Other Ambulatory Visit: Payer: Self-pay

## 2020-05-05 ENCOUNTER — Other Ambulatory Visit (INDEPENDENT_AMBULATORY_CARE_PROVIDER_SITE_OTHER): Payer: 59

## 2020-05-05 DIAGNOSIS — E061 Subacute thyroiditis: Secondary | ICD-10-CM | POA: Diagnosis not present

## 2020-05-05 LAB — T4, FREE: Free T4: 0.74 ng/dL (ref 0.60–1.60)

## 2020-05-05 LAB — TSH: TSH: 4.84 u[IU]/mL — ABNORMAL HIGH (ref 0.35–4.50)

## 2020-06-02 ENCOUNTER — Ambulatory Visit: Payer: 59 | Admitting: Internal Medicine

## 2020-06-02 ENCOUNTER — Other Ambulatory Visit: Payer: Self-pay

## 2020-06-02 VITALS — BP 126/76 | HR 62 | Ht 69.0 in | Wt 202.0 lb

## 2020-06-02 DIAGNOSIS — E061 Subacute thyroiditis: Secondary | ICD-10-CM

## 2020-06-02 LAB — TSH: TSH: 4.13 u[IU]/mL (ref 0.35–4.50)

## 2020-06-02 LAB — T4, FREE: Free T4: 0.77 ng/dL (ref 0.60–1.60)

## 2020-06-02 NOTE — Progress Notes (Signed)
Name: Clifford Rosario  MRN/ DOB: [id number], October 15, 1975    Age/ Sex: 45 y.o., male     PCP: Clifford Palmer, MD   Reason for Endocrinology Evaluation: Subacute thyroiditis      Initial Endocrinology Clinic Visit: 04/07/2020    PATIENT IDENTIFIER: Clifford Rosario is a 45 y.o., male with a past medical history of hyperthyroidism, psoriasis, fatty liver, pre-diabetes and GERD. He has followed with [facility] since 04/07/2020 for consultative assistance with management of his hyperthyroidism.   HISTORICAL SUMMARY:  He was diagnosed with hyperthyroidism in 03/2020 with a suppressed TSH  Of <0.01 uIU/mL and elevated total T4 at 15.5 ug/dL and elevated T3 at 856 ng/dL.  Sister  ( thyromegaly )And brother with thyroid disease ( hyperthyroidism)    TRAB negative   SUBJECTIVE:    Today (06/02/2020):  Clifford Rosario is here for subacute thyroiditis. He was treated with B-blockers during the hyperthyroid  phase. His TSH increased to 4.84 uIU/mL by 04/2020   He has been noted with weight gain  Denies constipation or depression   No local neck symptoms    HISTORY:  Past Medical History:  Past Medical History:  Diagnosis Date  . Autoimmune thyroiditis     Past Surgical History: No past surgical history on file.  Social History:  reports that he quit smoking about 8 years ago. He has never used smokeless tobacco. He reports that he does not drink alcohol and does not use drugs.  Family History: No family history on file.   HOME MEDICATIONS: Allergies as of 06/02/2020   No Known Allergies     Medication List       Accurate as of June 02, 2020  8:50 AM. If you have any questions, ask your nurse or doctor.        STOP taking these medications   atenolol 25 MG tablet Commonly known as: TENORMIN Stopped by: Scarlette Shorts, MD   predniSONE 10 MG tablet Commonly known as: DELTASONE Stopped by: Scarlette Shorts, MD     TAKE these medications    amLODipine 5 MG tablet Commonly known as: NORVASC Take 2 tablets by mouth daily.   fenofibrate 145 MG tablet Commonly known as: TRICOR TAKE 1 TABLET BY MOUTH EVERYDAY AT BEDTIME   hydrOXYzine 10 MG tablet Commonly known as: ATARAX/VISTARIL Take 10 mg by mouth at bedtime as needed.   omeprazole 10 MG capsule Commonly known as: PRILOSEC Take 10 mg by mouth daily.         OBJECTIVE:   PHYSICAL EXAM: VS: BP 126/76   Pulse 62   Ht 5\' 9"  (1.753 m)   Wt 202 lb (91.6 kg)   SpO2 99%   BMI 29.83 kg/m    EXAM: General: Pt appears well and is in NAD  Neck: General: Supple without adenopathy. Thyroid: Thyroid size normal.  No goiter or nodules appreciated. No thyroid bruit.  Lungs: Clear with good BS bilat with no rales, rhonchi, or wheezes  Heart: Auscultation: RRR.  Abdomen: Normoactive bowel sounds, soft, nontender, without masses or organomegaly palpable  Extremities:  BL LE: No pretibial edema normal ROM and strength.  Mental Status: Judgment, insight: Intact Orientation: Oriented to time, place, and person Mood and affect: No depression, anxiety, or agitation     DATA REVIEWED:  Results for JOSEFF, LUCKMAN (MRN Clifford Rosario) as of 06/02/2020 16:30  Ref. Range 06/02/2020 09:00  TSH Latest Ref Range: 0.35 - 4.50 uIU/mL 4.13  T4,Free(Direct) Latest Ref Range: 0.60 -  1.60 ng/dL 1.01     ASSESSMENT / PLAN / RECOMMENDATIONS:   1. Subacute Thyroiditis:   - Symptoms have resolved  - He was in the hyperthyroid phase in 03/2020  And hypothyroid in 04/2020 - Today he is clinically euthyroid  -Repeat TFTs are normal    Medications  Stop atenolol   Follow-up as needed    Signed electronically by: Lyndle Herrlich, MD  [facility] Endocrin[facility]  [facility] [address] [place] [place], [place] [address] Phone: [phone] FAX: [phone]      CC: Clifford Palmer, MD [address] [place] [address] Phone:  [phone]  Fax: [phone]   Return to Endocrinology clinic as below: No future appointments.

## 2020-07-30 ENCOUNTER — Other Ambulatory Visit: Payer: 59

## 2020-10-07 ENCOUNTER — Ambulatory Visit: Payer: 59 | Admitting: Internal Medicine

## 2021-01-27 DIAGNOSIS — I10 Essential (primary) hypertension: Secondary | ICD-10-CM | POA: Diagnosis not present

## 2021-01-27 DIAGNOSIS — Z1211 Encounter for screening for malignant neoplasm of colon: Secondary | ICD-10-CM | POA: Diagnosis not present

## 2021-01-27 DIAGNOSIS — E063 Autoimmune thyroiditis: Secondary | ICD-10-CM | POA: Diagnosis not present

## 2021-01-27 DIAGNOSIS — Z0000 Encounter for general adult medical examination without abnormal findings: Secondary | ICD-10-CM | POA: Diagnosis not present

## 2021-01-27 DIAGNOSIS — Z79899 Other long term (current) drug therapy: Secondary | ICD-10-CM | POA: Diagnosis not present

## 2021-01-27 DIAGNOSIS — E8881 Metabolic syndrome: Secondary | ICD-10-CM | POA: Diagnosis not present

## 2021-02-02 DIAGNOSIS — I10 Essential (primary) hypertension: Secondary | ICD-10-CM | POA: Diagnosis not present

## 2021-02-02 DIAGNOSIS — Z1211 Encounter for screening for malignant neoplasm of colon: Secondary | ICD-10-CM | POA: Diagnosis not present

## 2021-05-16 DIAGNOSIS — H524 Presbyopia: Secondary | ICD-10-CM | POA: Diagnosis not present

## 2021-09-20 IMAGING — CT CT NECK W/ CM
3 of 4 series · 12 of 33 positions shown, 14 images · IV contrast (omnipaque)
Comparison: Radiographs of the cervical spine 01/23/2017.

CLINICAL DATA: Neck mass, initial workup. Additional history
provided: Patient COVID positive 116, reports difficulty swallowing
which began while having COVID, loss of 20 pounds, diagnosed with
autoimmune thyroiditis, unable to sleep for the past 2 weeks.

EXAM:
CT NECK WITH CONTRAST
TECHNIQUE: Multidetector CT imaging of the neck was performed using the
standard protocol following the bolus administration of intravenous
contrast.
CONTRAST:  75mL OMNIPAQUE IOHEXOL 300 MG/ML  SOLN

[Series 3: neck 2.0 st · axial · 0.52mm/px · z∈[-311,-107]mm · 4 of 154 slices shown, 5 images]
[im 26/154  soft-tissue]
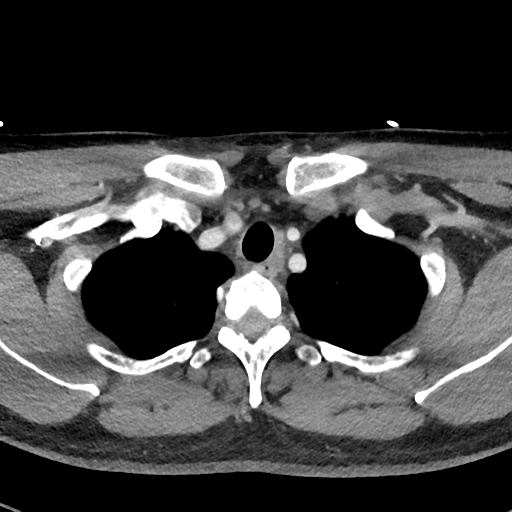
[im 26/154  bone]
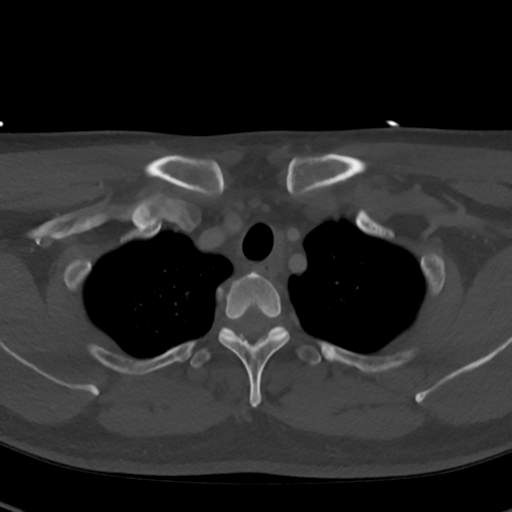
[im 52/154  bone]
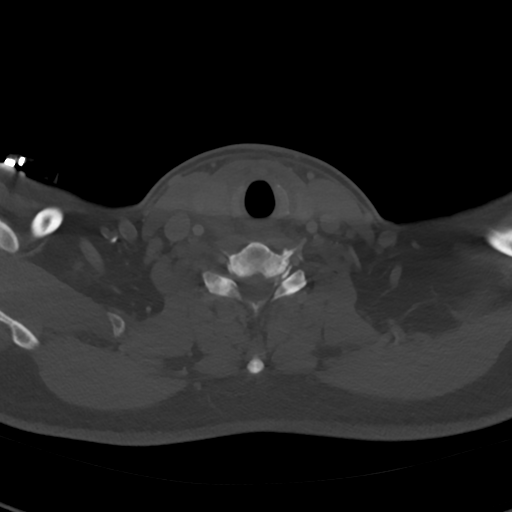
[im 103/154  bone]
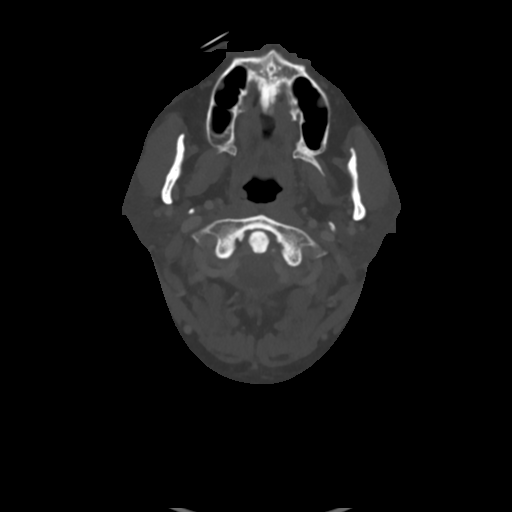
[im 128/154  bone]
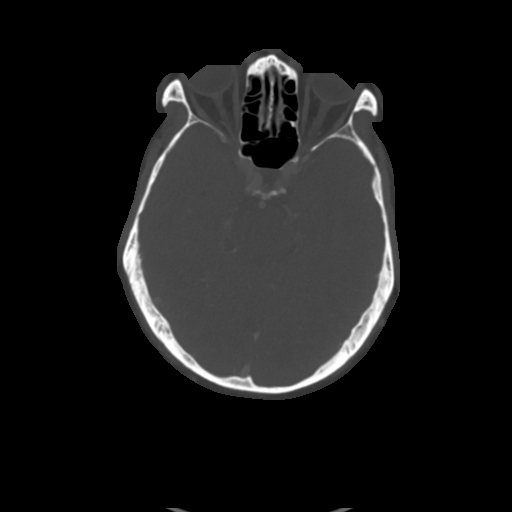

[Series 6: sagittal · sagittal · 0.51mm/px · 5 of 83 slices shown, 6 images]
[im 28/83  bone]
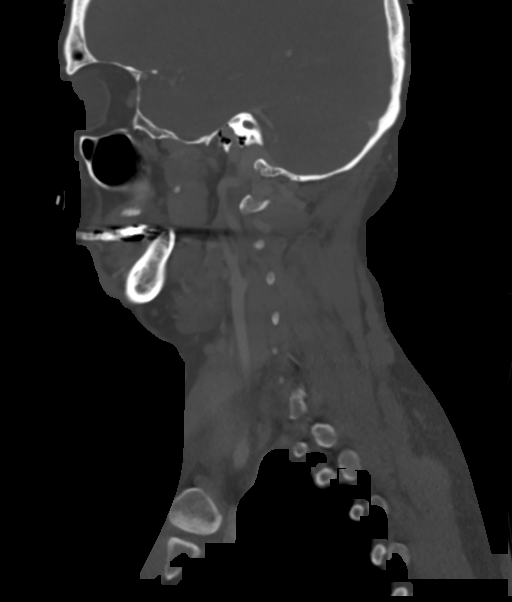
[im 35/83  bone]
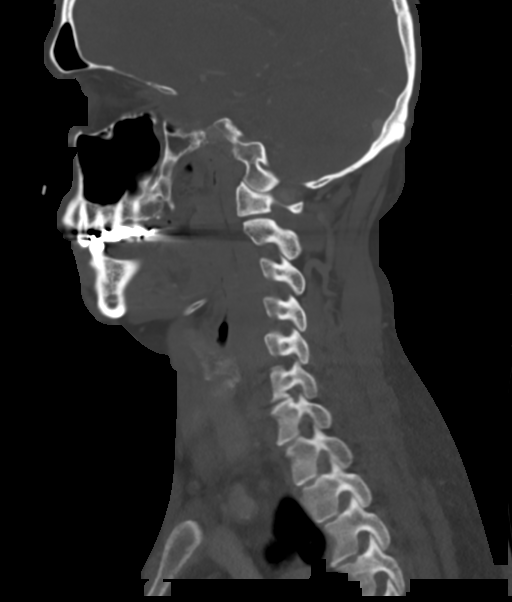
[im 42/83  soft-tissue]
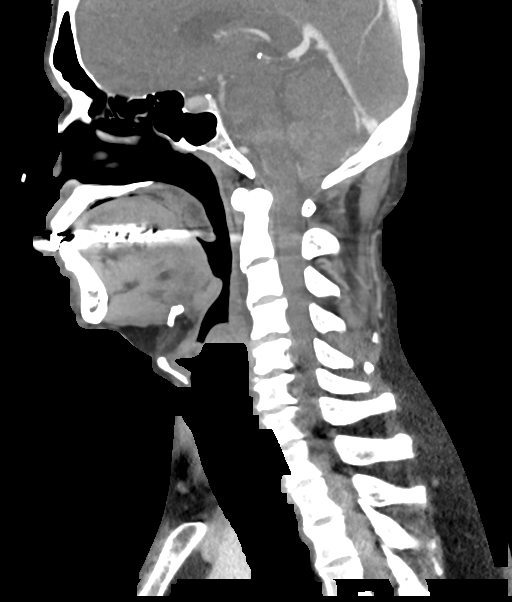
[im 42/83  bone]
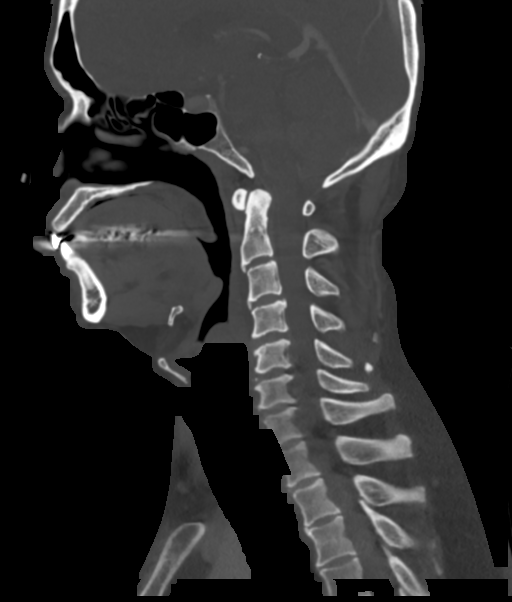
[im 48/83  bone]
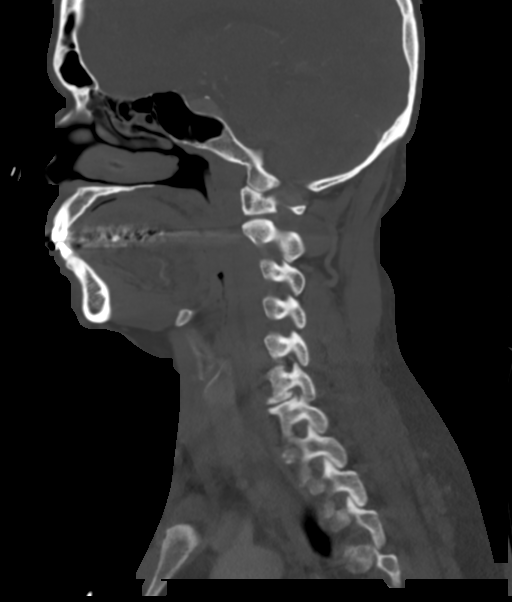
[im 55/83  bone]
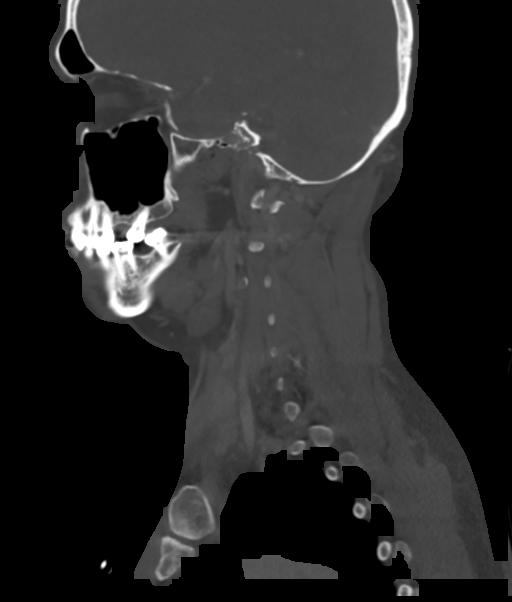

[Series 7: coronal · coronal · 0.32mm/px · 3 of 128 slices shown]
[im 26/128  bone]
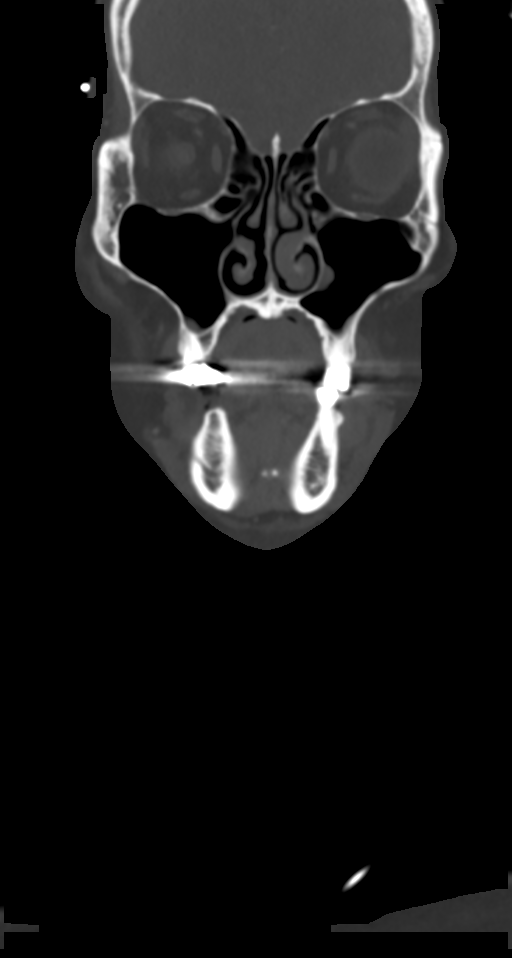
[im 51/128  bone]
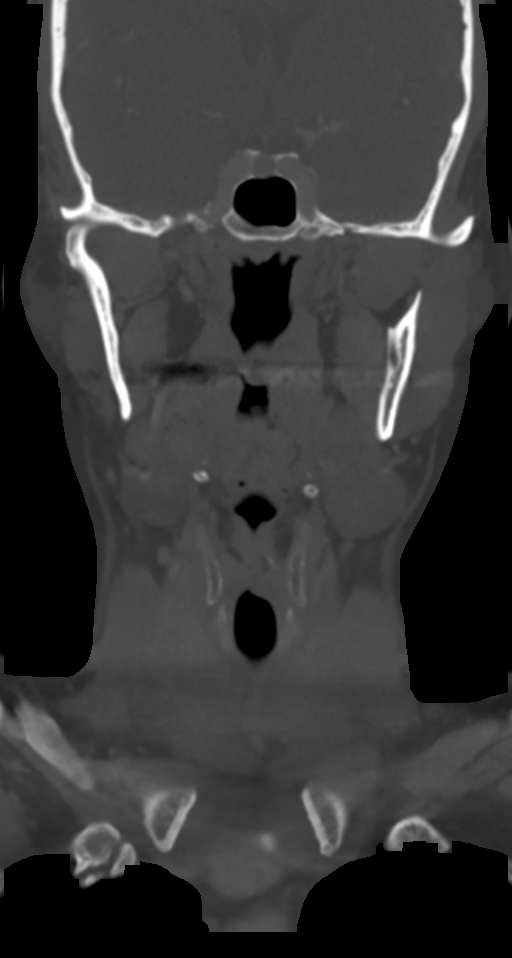
[im 77/128  bone]
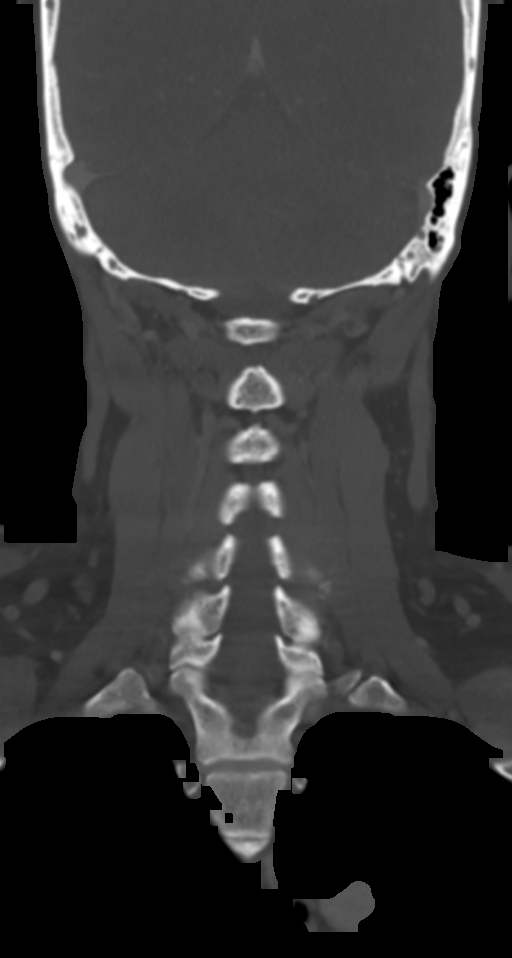

[12 of 33 positions shown; findings below may reference images not displayed]

FINDINGS: Pharynx and larynx: Streak and beam hardening artifact arising from
dental restoration partially obscures the oral cavity and pharynx.
No appreciable swelling or discrete mass within the oral cavity,
pharynx or larynx. Postinflammatory calcifications within the right
palatine tonsil. There is mild retropharyngeal edema measuring up to
4 mm in thickness, extending from the C2 level caudally to the C4-C5
level.

Salivary glands: No inflammation, mass, or stone.

Thyroid: 5 mm right thyroid lobe nodule, not meeting consensus
criteria for ultrasound follow-up. The thyroid gland is
heterogeneous in appearance with mild-to-moderate surrounding edema.

Lymph nodes: No pathologically enlarged cervical chain lymph nodes.

Vascular: The major vascular structures of the neck are patent.

Limited intracranial: No acute intracranial abnormality identified.

Visualized orbits: No mass or acute finding.

Mastoids and visualized paranasal sinuses: Mild ethmoid sinus
mucosal thickening. Mild partial opacification of the left ethmoid
air cells. Small bilateral maxillary sinus mucous retention cysts.
No significant mastoid effusion

Skeleton: Cervical spondylosis. No acute bony abnormality or
aggressive osseous lesion.

Upper chest: No consolidation within the imaged lung apices.
IMPRESSION: Heterogeneous appearance of the thyroid gland with mild-to-moderate
surrounding edema. Findings are compatible with the provided history
of thyroiditis.

Mild retropharyngeal edema measuring up to 4 mm in thickness and
spanning the C2 through C4-C5 levels. This edema is likely
inflammatory/infectious in etiology, possibly secondary to the above
described thyroiditis.

Mild ethmoid sinusitis.

Small bilateral maxillary sinus mucous retention cysts.

## 2021-10-20 DIAGNOSIS — Z1211 Encounter for screening for malignant neoplasm of colon: Secondary | ICD-10-CM | POA: Diagnosis not present

## 2021-10-20 DIAGNOSIS — K5730 Diverticulosis of large intestine without perforation or abscess without bleeding: Secondary | ICD-10-CM | POA: Diagnosis not present

## 2022-02-07 DIAGNOSIS — E061 Subacute thyroiditis: Secondary | ICD-10-CM | POA: Diagnosis not present

## 2022-02-07 DIAGNOSIS — Z125 Encounter for screening for malignant neoplasm of prostate: Secondary | ICD-10-CM | POA: Diagnosis not present

## 2022-02-07 DIAGNOSIS — L219 Seborrheic dermatitis, unspecified: Secondary | ICD-10-CM | POA: Diagnosis not present

## 2022-02-07 DIAGNOSIS — Z0000 Encounter for general adult medical examination without abnormal findings: Secondary | ICD-10-CM | POA: Diagnosis not present

## 2022-02-07 DIAGNOSIS — E8881 Metabolic syndrome: Secondary | ICD-10-CM | POA: Diagnosis not present

## 2022-03-28 DIAGNOSIS — E782 Mixed hyperlipidemia: Secondary | ICD-10-CM | POA: Diagnosis not present

## 2022-03-28 DIAGNOSIS — E8881 Metabolic syndrome: Secondary | ICD-10-CM | POA: Diagnosis not present

## 2022-06-15 DIAGNOSIS — E8881 Metabolic syndrome: Secondary | ICD-10-CM | POA: Diagnosis not present

## 2022-11-15 DIAGNOSIS — E061 Subacute thyroiditis: Secondary | ICD-10-CM | POA: Diagnosis not present

## 2022-11-15 DIAGNOSIS — E8881 Metabolic syndrome: Secondary | ICD-10-CM | POA: Diagnosis not present

## 2022-11-23 DIAGNOSIS — E119 Type 2 diabetes mellitus without complications: Secondary | ICD-10-CM | POA: Diagnosis not present

## 2023-02-09 DIAGNOSIS — N181 Chronic kidney disease, stage 1: Secondary | ICD-10-CM | POA: Diagnosis not present

## 2023-02-09 DIAGNOSIS — E063 Autoimmune thyroiditis: Secondary | ICD-10-CM | POA: Diagnosis not present

## 2023-02-09 DIAGNOSIS — Z23 Encounter for immunization: Secondary | ICD-10-CM | POA: Diagnosis not present

## 2023-02-09 DIAGNOSIS — E782 Mixed hyperlipidemia: Secondary | ICD-10-CM | POA: Diagnosis not present

## 2023-02-09 DIAGNOSIS — E119 Type 2 diabetes mellitus without complications: Secondary | ICD-10-CM | POA: Diagnosis not present

## 2023-02-09 DIAGNOSIS — Z79899 Other long term (current) drug therapy: Secondary | ICD-10-CM | POA: Diagnosis not present

## 2023-02-09 DIAGNOSIS — Z0000 Encounter for general adult medical examination without abnormal findings: Secondary | ICD-10-CM | POA: Diagnosis not present

## 2023-03-27 DIAGNOSIS — R509 Fever, unspecified: Secondary | ICD-10-CM | POA: Diagnosis not present

## 2023-03-27 DIAGNOSIS — Z03818 Encounter for observation for suspected exposure to other biological agents ruled out: Secondary | ICD-10-CM | POA: Diagnosis not present

## 2023-08-03 DIAGNOSIS — Z79899 Other long term (current) drug therapy: Secondary | ICD-10-CM | POA: Diagnosis not present

## 2023-08-03 DIAGNOSIS — E782 Mixed hyperlipidemia: Secondary | ICD-10-CM | POA: Diagnosis not present

## 2023-08-03 DIAGNOSIS — R748 Abnormal levels of other serum enzymes: Secondary | ICD-10-CM | POA: Diagnosis not present

## 2023-08-03 DIAGNOSIS — E119 Type 2 diabetes mellitus without complications: Secondary | ICD-10-CM | POA: Diagnosis not present

## 2023-08-07 ENCOUNTER — Other Ambulatory Visit (HOSPITAL_BASED_OUTPATIENT_CLINIC_OR_DEPARTMENT_OTHER): Payer: Self-pay | Admitting: Family Medicine

## 2023-08-07 DIAGNOSIS — E782 Mixed hyperlipidemia: Secondary | ICD-10-CM

## 2023-08-15 ENCOUNTER — Ambulatory Visit (HOSPITAL_BASED_OUTPATIENT_CLINIC_OR_DEPARTMENT_OTHER)
Admission: RE | Admit: 2023-08-15 | Discharge: 2023-08-15 | Disposition: A | Discharge status: Home / Self Care | Payer: Self-pay | Source: Ambulatory Visit | Attending: Family Medicine | Admitting: Family Medicine

## 2023-08-15 DIAGNOSIS — E782 Mixed hyperlipidemia: Secondary | ICD-10-CM | POA: Insufficient documentation

## 2024-03-27 NOTE — Progress Notes (Signed)
 Clifford Rosario                                          MRN: [id number]   03/27/2024   The [facility] Quality Team Specialist reviewed this patient medical record for the purposes of chart review for care gap closure. The following were reviewed: chart review for care gap closure-diabetic eye exam.    [facility] Quality Team
